# Patient Record
Sex: Female | Born: 2000 | Race: White | Hispanic: No | Marital: Single | Smoking: Never smoker
Health system: Southern US, Community
[De-identification: ages and names within clinical notes are randomized; demographics above are authoritative.]

## PROBLEM LIST (undated history)

## (undated) DIAGNOSIS — F32A Depression, unspecified: Secondary | ICD-10-CM

## (undated) DIAGNOSIS — D649 Anemia, unspecified: Secondary | ICD-10-CM

## (undated) HISTORY — DX: Depression, unspecified: F32.A

## (undated) HISTORY — DX: Anemia, unspecified: D64.9

---
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
## 2018-11-12 ENCOUNTER — Ambulatory Visit: Payer: Self-pay | Admitting: Psychology

## 2018-11-19 ENCOUNTER — Ambulatory Visit: Payer: Self-pay | Admitting: Psychology

## 2018-11-20 ENCOUNTER — Ambulatory Visit: Payer: Self-pay | Admitting: Psychology

## 2019-11-04 ENCOUNTER — Ambulatory Visit: Payer: 59 | Attending: Internal Medicine

## 2021-04-19 ENCOUNTER — Ambulatory Visit: Payer: 59 | Admitting: Emergency Medicine

## 2021-04-19 DIAGNOSIS — Z0289 Encounter for other administrative examinations: Secondary | ICD-10-CM

## 2022-10-19 ENCOUNTER — Other Ambulatory Visit (HOSPITAL_COMMUNITY)
Admission: RE | Admit: 2022-10-19 | Discharge: 2022-10-19 | Disposition: A | Discharge status: Home / Self Care | Payer: 59 | Source: Ambulatory Visit | Attending: Radiology | Admitting: Radiology

## 2022-10-19 ENCOUNTER — Encounter: Payer: Self-pay | Admitting: Radiology

## 2022-10-19 ENCOUNTER — Ambulatory Visit: Payer: 59 | Admitting: Radiology

## 2022-10-19 VITALS — BP 112/68 | Ht 70.0 in | Wt 158.0 lb

## 2022-10-19 DIAGNOSIS — Z113 Encounter for screening for infections with a predominantly sexual mode of transmission: Secondary | ICD-10-CM

## 2022-10-19 DIAGNOSIS — Z3009 Encounter for other general counseling and advice on contraception: Secondary | ICD-10-CM

## 2022-10-19 DIAGNOSIS — Z01419 Encounter for gynecological examination (general) (routine) without abnormal findings: Secondary | ICD-10-CM | POA: Diagnosis present

## 2022-10-19 MED ORDER — DROSPIRENONE-ETHINYL ESTRADIOL 3-0.02 MG PO TABS: 1.0000 | ORAL_TABLET | Freq: Every day | ORAL | 0 refills | Status: AC

## 2022-10-19 NOTE — Progress Notes (Signed)
   Maureen Perry November 18, 2000 [id number]   History:  21 y.o. G0 presents for annual exam as a new patient. No gyn concerns. Interested in restarting BC, currently using condoms. Has yet to have pap. Previously used nuva ring (discomfort), paragard(heavy bleeding) and OCPs (depression). Interested in something that may help with acne.   Gynecologic History Patient's last menstrual period was 09/28/2022 (approximate). Period Cycle (Days): 28 Period Duration (Days): 6 Period Pattern: Regular Menstrual Flow: Moderate Menstrual Control: Tampon Dysmenorrhea: (!) Moderate Dysmenorrhea Symptoms: Cramping Contraception/Family planning: condoms Sexually active: yes   Obstetric History OB History  Gravida Para Term Preterm AB Living  0 0 0 0 0 0  SAB IAB Ectopic Multiple Live Births  0 0 0 0 0     The following portions of the patient's history were reviewed and updated as appropriate: allergies, current medications, past family history, past medical history, past social history, past surgical history, and problem list.  Review of Systems Pertinent items noted in HPI and remainder of comprehensive ROS otherwise negative.   Past medical history, past surgical history, family history and social history were all reviewed and documented in the EPIC chart.   Exam:  Vitals:   10/19/22 1328  BP: 112/68  Weight: 158 lb (71.7 kg)  Height: 5\' 10"  (1.778 m)   Body mass index is 22.67 kg/m.  General appearance:  Normal Thyroid:  Symmetrical, normal in size, without palpable masses or nodularity. Respiratory  Auscultation:  Clear without wheezing or rhonchi Cardiovascular  Auscultation:  Regular rate, without rubs, murmurs or gallops  Edema/varicosities:  Not grossly evident Abdominal  Soft,nontender, without masses, guarding or rebound.  Liver/spleen:  No organomegaly noted  Hernia:  None appreciated  Skin  Inspection:  Grossly normal Breasts: Examined lying and  sitting.   Right: Without masses, retractions, nipple discharge or axillary adenopathy.   Left: Without masses, retractions, nipple discharge or axillary adenopathy. Genitourinary   Inguinal/mons:  Normal without inguinal adenopathy  External genitalia:  Normal appearing vulva with no masses, tenderness, or lesions  BUS/Urethra/Skene's glands:  Normal without masses or exudate  Vagina:  Normal appearing with normal color and discharge, no lesions  Cervix:  Normal appearing without discharge or lesions  Uterus:  Normal in size, shape and contour.  Mobile, nontender  Adnexa/parametria:     Rt: Normal in size, without masses or tenderness.   Lt: Normal in size, without masses or tenderness.  Anus and perineum: Normal   Patient informed chaperone available to be present for breast and pelvic exam. Patient has requested no chaperone to be present. Patient has been advised what will be completed during breast and pelvic exam.   Assessment/Plan:   1. Well woman exam with routine gynecological exam - Cytology - PAP( [facility])  2. Screening for STDs (sexually transmitted diseases) - Cytology - PAP( [place] [facility])  3. General counseling and advice for contraceptive management - drospirenone-ethinyl estradiol (YAZ) 3-0.02 MG tablet; Take 1 tablet by mouth daily.  Dispense: 84 tablet; Refill: 0    F/U 3 months  Discussed SBE, pap and STI screening as directed/appropriate. Recommend 11-10-1996 of exercise weekly, including weight bearing exercise. Encouraged the use of seatbelts and sunscreen. Return in 1 year for annual or as needed.   B WHNP-BC 1:36 PM 10/19/2022

## 2022-10-20 ENCOUNTER — Telehealth: Payer: Self-pay | Admitting: *Deleted

## 2022-10-20 NOTE — Telephone Encounter (Signed)
PA done via cover my meds for Yaz via [facility]  pending response from insurance.

## 2022-10-21 NOTE — Telephone Encounter (Signed)
Request Reference Number: [id number]. DROSPIRENONE TAB ETHY EST is approved through 10/21/2023. Your patient may now fill this prescription and it will be covered.

## 2022-10-25 LAB — CYTOLOGY - PAP
Adequacy: ABSENT
Chlamydia: NEGATIVE
Comment: NEGATIVE
Comment: NEGATIVE
Comment: NEGATIVE
Comment: NORMAL
Diagnosis: UNDETERMINED — AB
High risk HPV: NEGATIVE
Neisseria Gonorrhea: NEGATIVE
Trichomonas: NEGATIVE

## 2024-09-18 ENCOUNTER — Other Ambulatory Visit: Payer: Self-pay | Admitting: Physician Assistant

## 2024-09-18 DIAGNOSIS — R1013 Epigastric pain: Secondary | ICD-10-CM

## 2024-09-18 DIAGNOSIS — R112 Nausea with vomiting, unspecified: Secondary | ICD-10-CM

## 2024-09-18 DIAGNOSIS — R634 Abnormal weight loss: Secondary | ICD-10-CM

## 2024-09-23 ENCOUNTER — Ambulatory Visit
Admission: RE | Admit: 2024-09-23 | Discharge: 2024-09-23 | Disposition: A | Source: Ambulatory Visit | Attending: Physician Assistant | Admitting: Physician Assistant

## 2024-09-23 DIAGNOSIS — R112 Nausea with vomiting, unspecified: Secondary | ICD-10-CM

## 2024-09-23 DIAGNOSIS — R1013 Epigastric pain: Secondary | ICD-10-CM

## 2024-09-23 DIAGNOSIS — R634 Abnormal weight loss: Secondary | ICD-10-CM

## 2024-09-23 MED ORDER — IOPAMIDOL (ISOVUE-300) INJECTION 61%
100.0000 mL | Freq: Once | INTRAVENOUS | Status: AC | PRN
  Administered 2024-09-23: 100 mL via INTRAVENOUS
